# Patient Record
Sex: Male | Born: 1990 | Race: Black or African American | Hispanic: No | Marital: Single | Smoking: Current some day smoker
Health system: Southern US, Community
[De-identification: ages and names within clinical notes are randomized; demographics above are authoritative.]

---
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
## 2014-10-13 ENCOUNTER — Encounter (HOSPITAL_COMMUNITY): Payer: Self-pay | Admitting: Emergency Medicine

## 2014-10-13 ENCOUNTER — Emergency Department (HOSPITAL_COMMUNITY): Payer: Self-pay

## 2014-10-13 ENCOUNTER — Emergency Department (HOSPITAL_COMMUNITY)
Admission: EM | Admit: 2014-10-13 | Discharge: 2014-10-14 | Disposition: A | Discharge status: Home / Self Care | Payer: Self-pay | Attending: Emergency Medicine | Admitting: Emergency Medicine

## 2014-10-13 DIAGNOSIS — J111 Influenza due to unidentified influenza virus with other respiratory manifestations: Secondary | ICD-10-CM | POA: Insufficient documentation

## 2014-10-13 DIAGNOSIS — W230XXA Caught, crushed, jammed, or pinched between moving objects, initial encounter: Secondary | ICD-10-CM | POA: Insufficient documentation

## 2014-10-13 DIAGNOSIS — Y9389 Activity, other specified: Secondary | ICD-10-CM | POA: Insufficient documentation

## 2014-10-13 DIAGNOSIS — S60221A Contusion of right hand, initial encounter: Secondary | ICD-10-CM | POA: Insufficient documentation

## 2014-10-13 DIAGNOSIS — Y9289 Other specified places as the place of occurrence of the external cause: Secondary | ICD-10-CM | POA: Insufficient documentation

## 2014-10-13 DIAGNOSIS — Z720 Tobacco use: Secondary | ICD-10-CM | POA: Insufficient documentation

## 2014-10-13 DIAGNOSIS — Y998 Other external cause status: Secondary | ICD-10-CM | POA: Insufficient documentation

## 2014-10-13 NOTE — Discharge Instructions (Signed)
TAKE IBUPROFEN AND/OR TYLENOL FOR ACHES AND IF ANY FEVER DEVELOPS. PUSH FLUIDS. RETURN HERE WITH ANY WORSENING SYMPTOMS OR NEW CONCERN. ICE TO THE RIGHT HAND WITH IBUPROFEN FOR PAIN.    Contusion A contusion is a deep bruise. Contusions are the result of an injury that caused bleeding under the skin. The contusion may turn blue, purple, or yellow. Minor injuries will give you a painless contusion, but more severe contusions may stay painful and swollen for a few weeks.  CAUSES  A contusion is usually caused by a blow, trauma, or direct force to an area of the body. SYMPTOMS   Swelling and redness of the injured area.  Bruising of the injured area.  Tenderness and soreness of the injured area.  Pain. DIAGNOSIS  The diagnosis can be made by taking a history and physical exam. An X-ray, CT scan, or MRI may be needed to determine if there were any associated injuries, such as fractures. TREATMENT  Specific treatment will depend on what area of the body was injured. In general, the best treatment for a contusion is resting, icing, elevating, and applying cold compresses to the injured area. Over-the-counter medicines may also be recommended for pain control. Ask your caregiver what the best treatment is for your contusion. HOME CARE INSTRUCTIONS   Put ice on the injured area.  Put ice in a plastic bag.  Place a towel between your skin and the bag.  Leave the ice on for 15-20 minutes, 3-4 times a day, or as directed by your health care provider.  Only take over-the-counter or prescription medicines for pain, discomfort, or fever as directed by your caregiver. Your caregiver may recommend avoiding anti-inflammatory medicines (aspirin, ibuprofen, and naproxen) for 48 hours because these medicines may increase bruising.  Rest the injured area.  If possible, elevate the injured area to reduce swelling. SEEK IMMEDIATE MEDICAL CARE IF:   You have increased bruising or swelling.  You have  pain that is getting worse.  Your swelling or pain is not relieved with medicines. MAKE SURE YOU:   Understand these instructions.  Will watch your condition.  Will get help right away if you are not doing well or get worse. Document Released: 05/13/2005 Document Revised: 08/08/2013 Document Reviewed: 06/08/2011 [facility] Patient Information 2015 [place], [place]. This information is not intended to replace advice given to you by your health care provider. Make sure you discuss any questions you have with your health care provider. Viral Infections A virus is a type of germ. Viruses can cause:  Minor sore throats.  Aches and pains.  Headaches.  Runny nose.  Rashes.  Watery eyes.  Tiredness.  Coughs.  Loss of appetite.  Feeling sick to your stomach (nausea).  Throwing up (vomiting).  Watery poop (diarrhea). HOME CARE   Only take medicines as told by your doctor.  Drink enough water and fluids to keep your pee (urine) clear or pale yellow. Sports drinks are a good choice.  Get plenty of rest and eat healthy. Soups and broths with crackers or rice are fine. GET HELP RIGHT AWAY IF:   You have a very bad headache.  You have shortness of breath.  You have chest pain or neck pain.  You have an unusual rash.  You cannot stop throwing up.  You have watery poop that does not stop.  You cannot keep fluids down.  You or your child has a temperature by mouth above 102 F (38.9 C), not controlled by medicine.  Your  baby is older than 3 months with a rectal temperature of 102 F (38.9 C) or higher.  Your baby is 87 months old or younger with a rectal temperature of 100.4 F (38 C) or higher. MAKE SURE YOU:   Understand these instructions.  Will watch this condition.  Will get help right away if you are not doing well or get worse. Document Released: 07/16/2008 Document Revised: 10/26/2011 Document Reviewed: 12/09/2010 [facility] Patient Information 2015  [place], [place]. This information is not intended to replace advice given to you by your health care provider. Make sure you discuss any questions you have with your health care provider.

## 2014-10-13 NOTE — ED Notes (Signed)
Patient c/o right hand pain, states he slammed it between a television and door while moving 2 days ago. Patient states he also has flu like symptoms, cough, runny nose. Patient is afebrile at this time. Patient has not taken anything for pain at this time.

## 2014-10-13 NOTE — ED Provider Notes (Signed)
CSN: [id number]     Arrival date & time 10/13/14  2053 History   First MD Initiated Contact with Patient 10/13/14 2207     Chief Complaint  Patient presents with  . Hand Pain    right, smashed between TV and door jam while moving  . flu like symptoms     child flu+ 2 days ago     (Consider location/radiation/quality/duration/timing/severity/associated sxs/prior Treatment) Patient is a 24 y.o. male presenting with hand pain and flu symptoms. The history is provided by the patient. No language interpreter was used.  Hand Pain This is a new problem. Associated symptoms include coughing, fatigue and myalgias. Pertinent negatives include no chills, fever or nausea. Associated symptoms comments: He sustained a hand injury while moving furniture 2 days ago and hand was caught between the furniture he was carrying and the door frame. .  Influenza Presenting symptoms: cough, fatigue and myalgias   Presenting symptoms: no fever and no nausea   Severity:  Moderate Duration:  1 day Associated symptoms: no chills   Associated symptoms comment:  Symptoms of congestion, body aches after son diagnosed with the flu this week. Multiple family members with similar symptoms.    History reviewed. No pertinent past medical history. History reviewed. No pertinent past surgical history. History reviewed. No pertinent family history. History  Substance Use Topics  . Smoking status: Current Some Day Smoker    Types: Cigarettes  . Smokeless tobacco: Not on file  . Alcohol Use: No    Review of Systems  Constitutional: Positive for fatigue. Negative for fever and chills.  HENT: Negative.   Respiratory: Positive for cough.   Cardiovascular: Negative.   Gastrointestinal: Negative.  Negative for nausea.  Musculoskeletal: Positive for myalgias.       Right hand pain  Skin: Negative.  Negative for wound.  Neurological: Negative.       Allergies  Review of patient's allergies indicates no known  allergies.  Home Medications   Prior to Admission medications   Medication Sig Start Date End Date Taking? Authorizing Provider  ibuprofen (ADVIL,MOTRIN) 200 MG tablet Take 600 mg by mouth every 6 (six) hours as needed for moderate pain.   Yes Historical Provider, MD   BP 101/51 mmHg  Pulse 86  Temp(Src) 98.2 F (36.8 C) (Oral)  Resp 18  SpO2 96% Physical Exam  Constitutional: He is oriented to person, place, and time. He appears well-developed and well-nourished.  HENT:  Head: Normocephalic.  Neck: Normal range of motion. Neck supple.  Cardiovascular: Normal rate and regular rhythm.   Pulmonary/Chest: Effort normal and breath sounds normal.  Abdominal: Soft. Bowel sounds are normal. There is no tenderness. There is no rebound and no guarding.  Musculoskeletal: Normal range of motion.  Right hand minimally swollen dorsally without discoloration, bony deformity or significant tenderness.   Neurological: He is alert and oriented to person, place, and time.  Skin: Skin is warm and dry. No rash noted.  Psychiatric: He has a normal mood and affect.    ED Course  Procedures (including critical care time) Labs Review Labs Reviewed - No data to display  Imaging Review Dg Hand Complete Right  10/13/2014   CLINICAL DATA:  Injury to the right index finger 2 days ago with persistent pain.  EXAM: RIGHT HAND - COMPLETE 3+ VIEW  COMPARISON:  None.  FINDINGS: Probable fracture deformity of the dorsal in plate of the distal phalanx of the right third finger. Right hand, including right second finger,  are otherwise normal. No acute displaced fractures identified. Soft tissues are unremarkable.  IMPRESSION: No acute bony abnormalities. Probable old healed fracture deformity of the distal phalanx of the right third finger.   Electronically Signed   By: Burman Nieves M.D.   On: 10/13/2014 22:51     EKG Interpretation None      MDM   Final diagnoses:  None    1. Right hand  contusion 2. Influenza  Hand injury is limited to contusion injury.  Multiple family members affected with similar symptoms after diagnosed flu in the house. Recommend supportive care.     Arnoldo Hooker, PA-C 10/13/14 2350  Candyce Churn III, MD 10/15/14 1537

## 2016-07-24 IMAGING — CR DG HAND COMPLETE 3+V*R*
3 series · 3 of 3 positions shown · non-contrast
Comparison: None.

CLINICAL DATA: Injury to the right index finger 2 days ago with
persistent pain.

EXAM:
RIGHT HAND - COMPLETE 3+ VIEW

[x hand pa right]
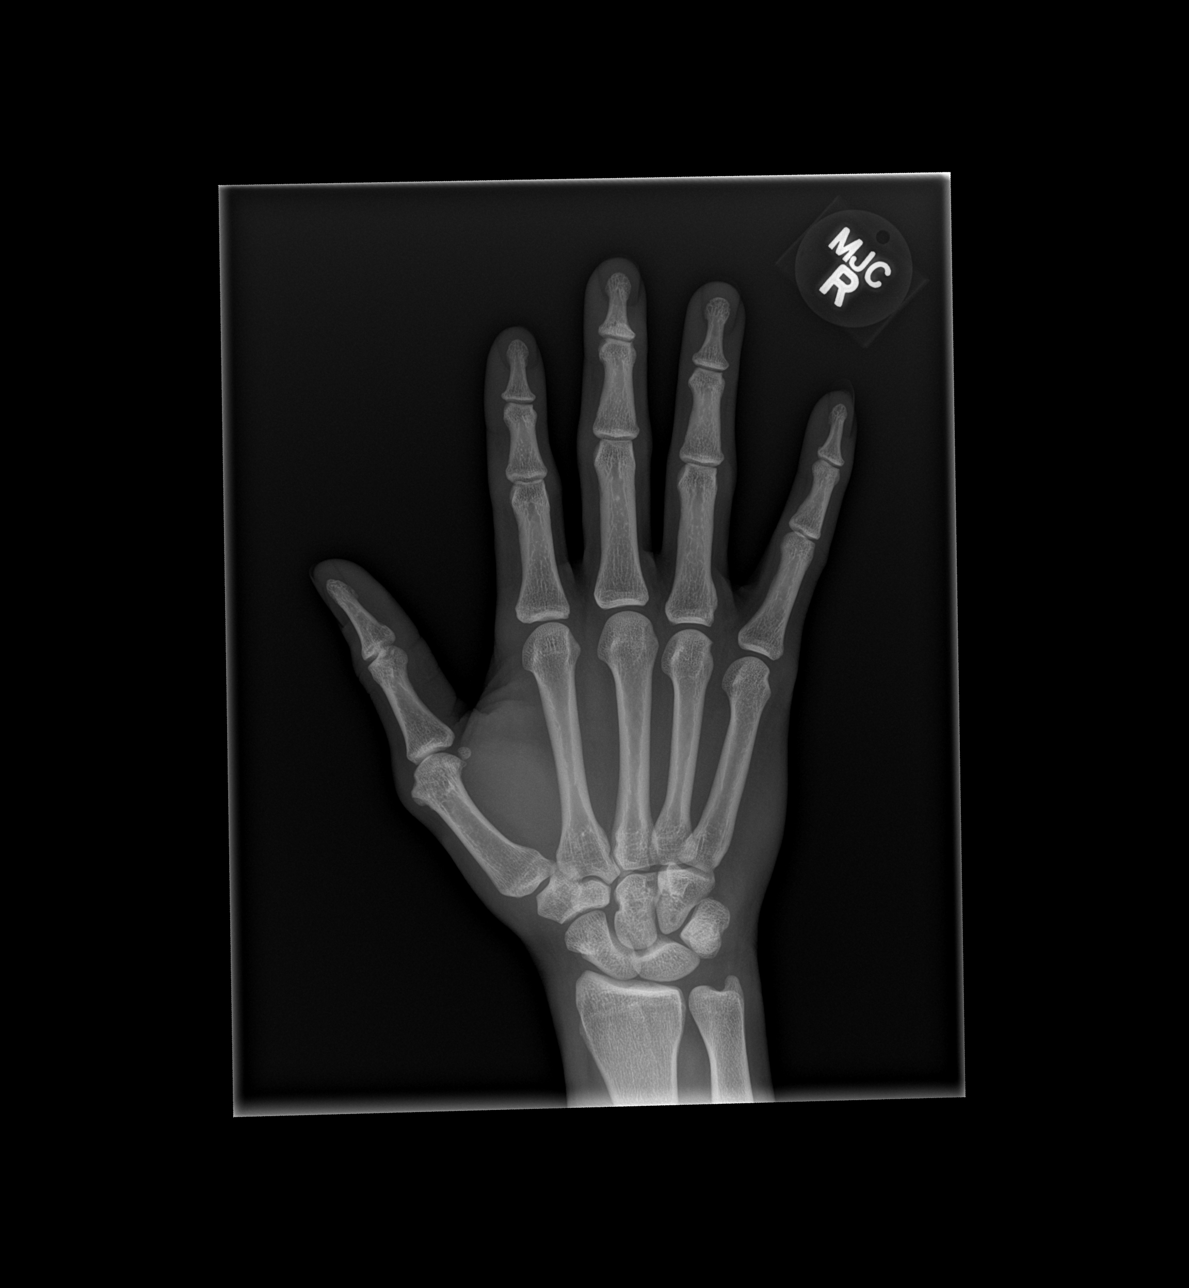

[x hand obl right]
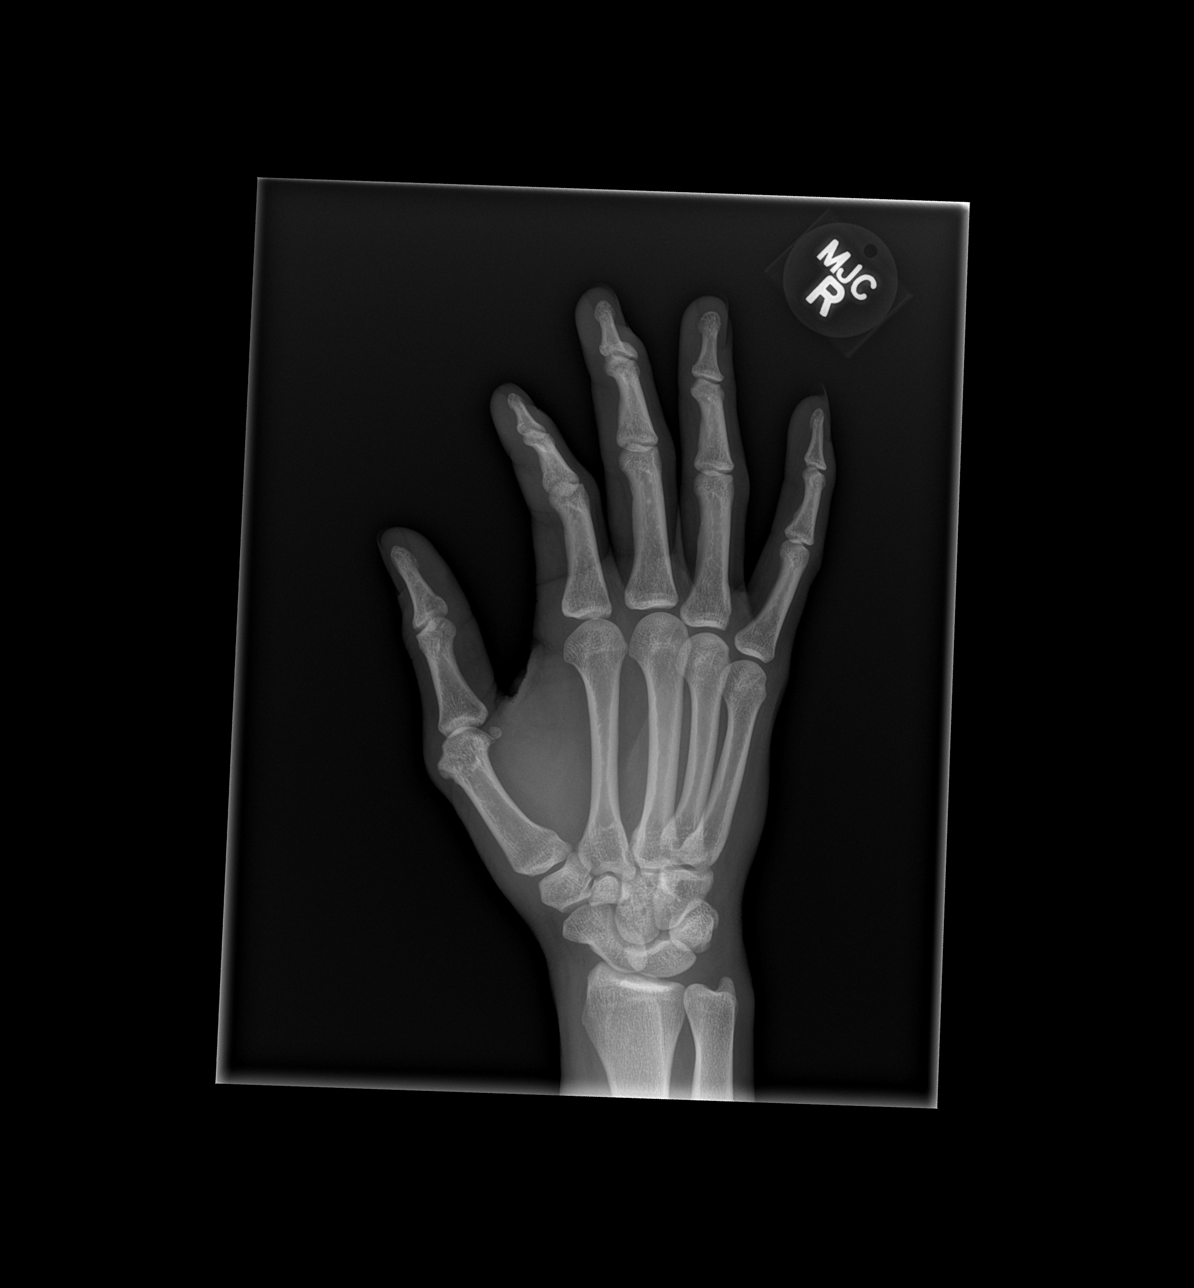

[x hand lat right]
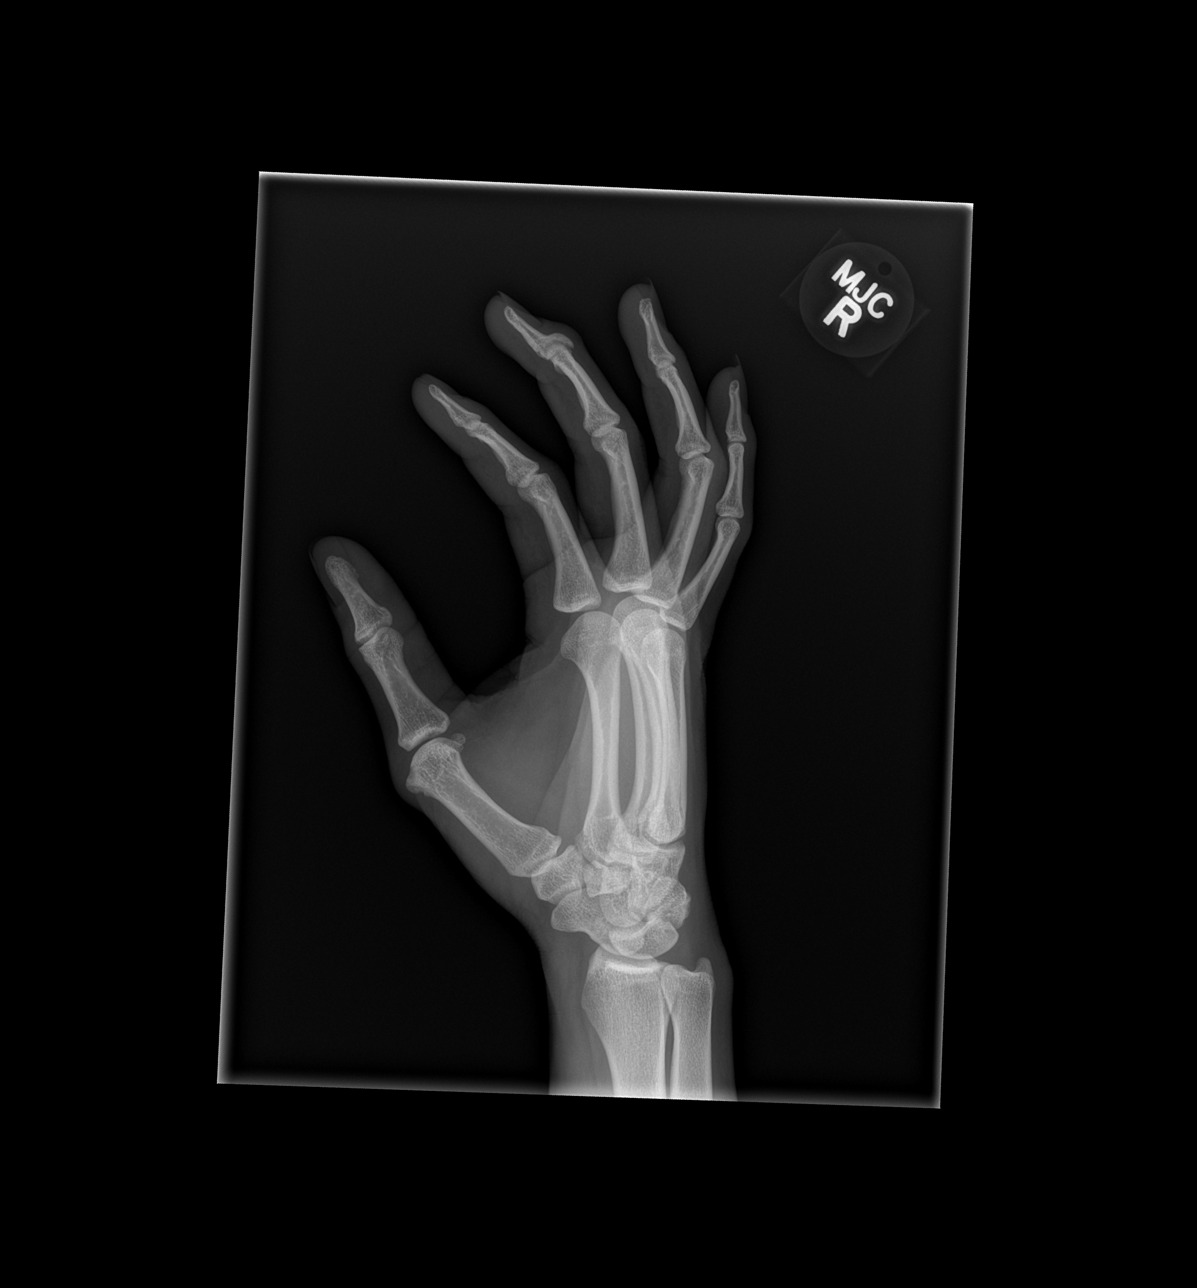

[3 of 3 positions shown; findings below may reference images not displayed]

FINDINGS: Probable fracture deformity of the dorsal in plate of the distal
phalanx of the right third finger. Right hand, including right
second finger, are otherwise normal. No acute displaced fractures
identified. Soft tissues are unremarkable.
IMPRESSION: No acute bony abnormalities. Probable old healed fracture deformity
of the distal phalanx of the right third finger.

## 2024-03-31 ENCOUNTER — Other Ambulatory Visit: Payer: Self-pay

## 2024-03-31 ENCOUNTER — Emergency Department (HOSPITAL_COMMUNITY)
Admission: EM | Admit: 2024-03-31 | Discharge: 2024-04-01 | Discharge status: Against Medical Advice | Attending: Emergency Medicine | Admitting: Emergency Medicine

## 2024-03-31 ENCOUNTER — Encounter (HOSPITAL_COMMUNITY): Payer: Self-pay | Admitting: *Deleted

## 2024-03-31 DIAGNOSIS — R5383 Other fatigue: Secondary | ICD-10-CM | POA: Insufficient documentation

## 2024-03-31 DIAGNOSIS — R509 Fever, unspecified: Secondary | ICD-10-CM | POA: Diagnosis present

## 2024-03-31 DIAGNOSIS — M791 Myalgia, unspecified site: Secondary | ICD-10-CM | POA: Insufficient documentation

## 2024-03-31 DIAGNOSIS — J3489 Other specified disorders of nose and nasal sinuses: Secondary | ICD-10-CM | POA: Insufficient documentation

## 2024-03-31 DIAGNOSIS — Z5321 Procedure and treatment not carried out due to patient leaving prior to being seen by health care provider: Secondary | ICD-10-CM | POA: Diagnosis not present

## 2024-03-31 LAB — URINALYSIS, ROUTINE W REFLEX MICROSCOPIC
Bacteria, UA: NONE SEEN
Bilirubin Urine: NEGATIVE
Glucose, UA: NEGATIVE mg/dL
Hgb urine dipstick: NEGATIVE
Ketones, ur: 20 mg/dL — AB
Nitrite: NEGATIVE
Protein, ur: NEGATIVE mg/dL
Specific Gravity, Urine: 1.02 (ref 1.005–1.030)
pH: 6 (ref 5.0–8.0)

## 2024-03-31 LAB — COMPREHENSIVE METABOLIC PANEL WITH GFR
ALT: 15 U/L (ref 0–44)
AST: 24 U/L (ref 15–41)
Albumin: 3.9 g/dL (ref 3.5–5.0)
Alkaline Phosphatase: 43 U/L (ref 38–126)
Anion gap: 10 (ref 5–15)
BUN: 8 mg/dL (ref 6–20)
CO2: 23 mmol/L (ref 22–32)
Calcium: 8.8 mg/dL — ABNORMAL LOW (ref 8.9–10.3)
Chloride: 102 mmol/L (ref 98–111)
Creatinine, Ser: 1.16 mg/dL (ref 0.61–1.24)
GFR, Estimated: >60 mL/min (ref >60)
Glucose, Bld: 151 mg/dL — ABNORMAL HIGH (ref 70–99)
Potassium: 4.1 mmol/L (ref 3.5–5.1)
Sodium: 135 mmol/L (ref 135–145)
Total Bilirubin: 0.6 mg/dL (ref 0.0–1.2)
Total Protein: 6.7 g/dL (ref 6.5–8.1)

## 2024-03-31 LAB — CBC
HCT: 43.8 % (ref 39.0–52.0)
Hemoglobin: 14.6 g/dL (ref 13.0–17.0)
MCH: 28.6 pg (ref 26.0–34.0)
MCHC: 33.3 g/dL (ref 30.0–36.0)
MCV: 85.9 fL (ref 80.0–100.0)
Platelets: 176 K/uL (ref 150–400)
RBC: 5.1 MIL/uL (ref 4.22–5.81)
RDW: 13.9 % (ref 11.5–15.5)
WBC: 6.5 K/uL (ref 4.0–10.5)
nRBC: 0 % (ref 0.0–0.2)

## 2024-03-31 LAB — LIPASE, BLOOD: Lipase: 22 U/L (ref 11–51)

## 2024-03-31 NOTE — ED Triage Notes (Signed)
 The patient reports I think I have covid and reports runny nose, fatigue, scratchy throat, body aches, subjective fevers; onset two days ago.

## 2024-04-01 LAB — RESPIRATORY PANEL BY PCR

## 2024-04-01 NOTE — ED Notes (Signed)
 Called pt 3x, no answer.   KM
